# Patient Record
Sex: Female | Born: 1995 | Race: White | Hispanic: No | Marital: Single | Smoking: Never smoker
Health system: Southern US, Community
[De-identification: ages and names within clinical notes are randomized; demographics above are authoritative.]

## PROBLEM LIST (undated history)

## (undated) DIAGNOSIS — N200 Calculus of kidney: Secondary | ICD-10-CM

---

## 2015-01-05 ENCOUNTER — Encounter: Payer: Self-pay | Admitting: Sports Medicine

## 2015-01-05 ENCOUNTER — Other Ambulatory Visit: Payer: PRIVATE HEALTH INSURANCE

## 2015-01-05 ENCOUNTER — Ambulatory Visit
Admission: RE | Admit: 2015-01-05 | Discharge: 2015-01-05 | Disposition: A | Discharge status: Home / Self Care | Payer: PRIVATE HEALTH INSURANCE | Source: Ambulatory Visit | Attending: Sports Medicine | Admitting: Sports Medicine

## 2015-01-05 ENCOUNTER — Ambulatory Visit (INDEPENDENT_AMBULATORY_CARE_PROVIDER_SITE_OTHER): Payer: 59 | Admitting: Sports Medicine

## 2015-01-05 VITALS — BP 112/72 | Ht <= 58 in | Wt 97.0 lb

## 2015-01-05 DIAGNOSIS — M25552 Pain in left hip: Secondary | ICD-10-CM | POA: Diagnosis not present

## 2015-01-05 DIAGNOSIS — M84359A Stress fracture, hip, unspecified, initial encounter for fracture: Secondary | ICD-10-CM

## 2015-01-05 NOTE — Progress Notes (Signed)
Sara Holloway - 19 y.o. female MRN [id number]  Date of birth: 09-20-1996  SUBJECTIVE:  Including CC & ROS.  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 She presents today with 2 weeks of left hip and groin pain. Patient reports that the pain has been gradually increasing over the last 2 weeks. She noticed increased limping with walking and has significant pain within the groin which he describes as a severe soreness. She denies any decreased range of motion. Both her and the athletic trainers report decreased strength. She's been taking anti-inflammatories over-the-counter with fairly good control of her pain. Patient reports the pain sometimes wakes her up at night and is particularly bad in the morning. Patient has a history of stress fracture in her left tibia 2 years ago during high school. Most recent bone density for history of multiple fractures was done on in January 2016 results from PCPs office are pending.  She denies any urinary symptoms of dysuria and no hematuria no constipation or diarrhea. Patient reports normal menstrual cycle monthly lasting 3-4 days of bleeding with no heavy consistency. Patient also reports a fairly poor diet little calcium, no additional vitamin D, poor protein intake. She is a [employer] and lives in the dorms.   ROS: Review of systems otherwise negative except for information present in HPI  HISTORY: Past Medical, Surgical, Social, and Family History Reviewed & Updated per EMR. Pertinent Historical Findings include: Patient reports a past medical history of left tibial stress fracture in 2013 or 14, history of left abductor strains in the past, history of urinary issues in the past. History of osteopenia. No family history of bone or joint disorders Patient is a nonsmoker nondrinker no illegal drug use Reports a fairly poor  diet Is a student ago for college  PHYSICAL EXAM:  VS: BP:112/72 mmHg  HR: bpm  TEMP: ( )  RESP:   HT:4\' 9"  (144.8 cm)   WT:97 lb (43.999 kg)  BMI:  LEFT HIP EXAM:  General: well nourished Skin of LE: warm; dry, no rashes, lesions, ecchymosis or erythema. Vascular: Dorsal pedal and femoral pulses 2+ bilaterally Neurologically: Sensation to light touch lower extremities equal and intact bilaterally.    Observation - no ecchymosis, edema, or hematoma present over the anterior, lateral, or posterior soft tissue surrounding the hip Palpation:  Yes anterior hip joint tenderness Mild point tenderness over left ASIS, more severe point tenderness along the pubic ramus and pubic synthesis. No tenderness of the lateral greater trochanter or bursa, No PSIS tenderness or SI joint pain ROM: Normal Hip motion in flexion, extension, internal and external rotation Muscle strength:Pain and 3/5 weakness in iliopsoas and rectus femoris flexion, normal hamstring extension and gluteal extension at the hip. No pain or weakness with abduction or adduction 5/ 5 compared to the right No pain or weakness with gluteus medius and minimus hip abduction, abdominal muscle contraction forward or oblique positions Pain with single leg hop deep in the groin  MSK US: MSK ultrasound of femoral neck, head and proximal shaft is possibly concerning for periosteal changes approximately 3-4 cm from the femoral head along the medial shaft there is a region of change with a associated blood vessel. Cannot distinguish as this periosteal change is related to shadowing from a blood vessel. Pubic ramus and symphysis did not reveal any bony abnormalities. Musculature of the abductors is  unremarkable.  ASSESSMENT & PLAN: See problem based charting & AVS for pt instructions. Patient is an 19 year old long-distance runner with past medical history of tibial stress fractures, osteopenia, normal menstruations with poor nutrition who  presents today with 2 weeks of left hip pain.  Differential diagnosis: Stress fracture to the femoral neck, stress fracture to the pubic ramus, possible labral tear, rectus femoris or iliopsoas muscular strain.  Recommendations: -Given the patient's history of high mileage running osteopenia poor nutrition and history of stress fractures and most concern for possible pelvic stress fracture either in the pubic ramus or femoral neck. Her ultrasound exam is also concerning with periosteal change within the femoral neck. Plan to further evaluate the patient with bilateral pelvic AP view and left hip x-rays. Patient's also been scheduled for an MRI to further classify and evaluate for possible stress fracture or muscular pathology. Patient was agreeable with this plan. -Given the patient's history of poor nutrition and osteopenia also sent her for a vitamin D 3 level and TSH level.  Follow-up with patient over the phone with test results and discuss further management options at that time.  We spent greater than 50% of our 30 minute office visit in counseling education regarding the patient current clinical problem, risks and benefits of treatment options, and recommend plans for treatment or further evaluation

## 2015-01-06 LAB — TSH: TSH: 0.765 u[IU]/mL (ref 0.350–4.500)

## 2015-01-06 LAB — VITAMIN D 25 HYDROXY (VIT D DEFICIENCY, FRACTURES): Vit D, 25-Hydroxy: 32 ng/mL (ref 30–100)

## 2015-01-07 ENCOUNTER — Ambulatory Visit
Admission: RE | Admit: 2015-01-07 | Discharge: 2015-01-07 | Disposition: A | Discharge status: Home / Self Care | Payer: PRIVATE HEALTH INSURANCE | Source: Ambulatory Visit | Attending: Sports Medicine | Admitting: Sports Medicine

## 2015-01-07 DIAGNOSIS — M25552 Pain in left hip: Secondary | ICD-10-CM

## 2015-01-12 ENCOUNTER — Ambulatory Visit (INDEPENDENT_AMBULATORY_CARE_PROVIDER_SITE_OTHER): Payer: 59 | Admitting: Sports Medicine

## 2015-01-12 ENCOUNTER — Encounter: Payer: Self-pay | Admitting: Sports Medicine

## 2015-01-12 VITALS — BP 115/67 | HR 60 | Ht <= 58 in | Wt 97.0 lb

## 2015-01-12 DIAGNOSIS — M84359A Stress fracture, hip, unspecified, initial encounter for fracture: Secondary | ICD-10-CM

## 2015-01-12 DIAGNOSIS — M84353A Stress fracture, unspecified femur, initial encounter for fracture: Secondary | ICD-10-CM

## 2015-01-12 LAB — FERRITIN: FERRITIN: 22 ng/mL (ref 10–291)

## 2015-01-12 NOTE — Progress Notes (Signed)
[facility] drew:  CBC with diff, Ferritin

## 2015-01-13 LAB — CBC WITH DIFFERENTIAL/PLATELET
BASOS PCT: 0 % (ref 0–1)
Basophils Absolute: 0 10*3/uL (ref 0.0–0.1)
EOS PCT: 1 % (ref 0–5)
Eosinophils Absolute: 0.1 10*3/uL (ref 0.0–0.7)
HEMATOCRIT: 37.8 % (ref 36.0–46.0)
Hemoglobin: 12.5 g/dL (ref 12.0–15.0)
Lymphocytes Relative: 38 % (ref 12–46)
Lymphs Abs: 2.2 10*3/uL (ref 0.7–4.0)
MCH: 30.2 pg (ref 26.0–34.0)
MCHC: 33.1 g/dL (ref 30.0–36.0)
MCV: 91.3 fL (ref 78.0–100.0)
MONO ABS: 0.5 10*3/uL (ref 0.1–1.0)
MONOS PCT: 8 % (ref 3–12)
MPV: 9.5 fL (ref 8.6–12.4)
NEUTROS ABS: 3.1 10*3/uL (ref 1.7–7.7)
Neutrophils Relative %: 53 % (ref 43–77)
Platelets: 262 10*3/uL (ref 150–400)
RBC: 4.14 MIL/uL (ref 3.87–5.11)
RDW: 12.9 % (ref 11.5–15.5)
WBC: 5.9 10*3/uL (ref 4.0–10.5)

## 2015-01-13 NOTE — Progress Notes (Addendum)
Sara Holloway - 19 y.o. female MRN [id number]  Date of birth: [birth year]  SUBJECTIVE:  Including CC & ROS.  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  She presents for follow up after experiencing groin pain for 2 weeks of left hip and groin pain gradually increasing over the last 2 weeks. She noticed increased limping with walking and has significant pain within the groin which he describes as a severe soreness.   At previous office visit ultrasound exam was concerning for possible femoral neck or proximal shaft fracture and thus was sent for x-rays and MRI. X-rays done in the past week were unrevealing for fracture. However MRI revealed a superior medial compression side femoral neck stress fracture with grade 4 edema within the femoral neck.  Currently the patient is on crutches which was started on Monday in the training room. Patient came in today to get fitted for crutches and also to counsel.    ROS: Review of systems otherwise negative except for information present in HPI She denies any urinary symptoms of dysuria and no hematuria no constipation or diarrhea. Patient reports normal menstrual cycle monthly lasting 3-4 days of bleeding with no heavy consistency. Patient also reports a fairly poor diet little calcium, no additional vitamin D, poor protein intake. She is a [employer] and lives in the dorms.  HISTORY: Past Medical, Surgical, Social, and Family History Reviewed & Updated per EMR. Pertinent Historical Findings include: Patient reports a past medical history of left tibial stress fracture in 2013 or 14, history of left abductor strains in the past, history of urinary issues in the past.  History of osteopenia - -Recent DEXA scan from January 2016 show bone mineral  density less than expected based on age with a Z score of -2.2 No family history of bone or joint disorders Patient is a nonsmoker nondrinker no illegal drug use Reports a fairly poor diet Is a student ago for college  PHYSICAL EXAM:  VS: BP:115/67 mmHg  HR:60bpm  TEMP: ( )  RESP:   HT:4\' 9"  (144.8 cm)   WT:97 lb (43.999 kg)  BMI:21 LEFT HIP EXAM:  General: well nourished Skin of LE: warm; dry, no rashes, lesions, ecchymosis or erythema. Vascular: Dorsal pedal and femoral pulses 2+ bilaterally Neurologically: Sensation to light touch lower extremities equal and intact bilaterally.    Observation - no ecchymosis, edema, or hematoma present over the anterior, lateral, or posterior soft tissue surrounding the hip Palpation:  Yes anterior hip joint tenderness, some pubic ramus tenderness Mild point tenderness over left ASIS, more severe point tenderness along the pubic ramus and pubic synthesis. No tenderness of the lateral greater trochanter or bursa, No PSIS tenderness or SI joint pain ROM: Normal Hip motion in flexion, extension, internal and external rotation Muscle strength:Pain and 4/5 weakness in iliopsoas and rectus femoris flexion, normal hamstring extension and gluteal extension at the hip. No pain or weakness with abduction or adduction 5/ 5 compared to the right No pain or weakness with gluteus medius and minimus hip abduction, abdominal muscle contraction forward or oblique positions Pain with single leg hop deep in the groin  MSK [place]: Repeat MSK ultrasound shows possible calcification changes at the proximal femoral neck on the medial aspect  ASSESSMENT & PLAN: See problem based charting &  AVS for pt instructions. Patient is an [age] year old long-distance runner with Compression side left femoral neck stress fracture grade IV edema on MRI  Recommendations: -Patient will remain on crutches for 6 weeks -Educated patient about diet recommendations including increasing  calcium rich foods, increasing protein, and starting vitamin D supplementation at least 1000 mg daily. -Lab work obtained including vitamin D 3 level, TSH level, CBC level, and ferritin level orthotic in normal limits -Patient may participate in swimming with taking off the wall as long she remains pain-free, other crosstraining included crunches, upper body lifting, arm bike. Patient may not do any stationary bike walking or running. -Patient will follow-up in 5 weeks to reassess hopefully with ultrasound and clinically  We spent greater than 50% of our 25 minute office visit in counseling education regarding the patient current clinical problem, risks and benefits of treatment options, and recommend plans for treatment or further evaluation  Adding Iron supplementation for ferritin level of 22 and will treat with ferrous sulfate 325mg  BID recheck in 4 weeks

## 2015-01-17 ENCOUNTER — Encounter: Payer: Self-pay | Admitting: Sports Medicine

## 2015-01-20 MED ORDER — FERROUS SULFATE 325 (65 FE) MG PO TABS: 325.0000 mg | ORAL_TABLET | Freq: Two times a day (BID) | ORAL | Status: AC

## 2015-01-20 NOTE — Addendum Note (Signed)
Addended by: Alonza SmokerIDIANO, Koleman Marling M on: 01/20/2015 08:47 AM   Modules accepted: Orders

## 2015-02-16 ENCOUNTER — Ambulatory Visit (INDEPENDENT_AMBULATORY_CARE_PROVIDER_SITE_OTHER): Payer: 59 | Admitting: Sports Medicine

## 2015-02-16 ENCOUNTER — Ambulatory Visit
Admission: RE | Admit: 2015-02-16 | Discharge: 2015-02-16 | Disposition: A | Discharge status: Home / Self Care | Payer: 59 | Source: Ambulatory Visit | Attending: Sports Medicine | Admitting: Sports Medicine

## 2015-02-16 ENCOUNTER — Encounter: Payer: Self-pay | Admitting: Sports Medicine

## 2015-02-16 VITALS — BP 122/58 | HR 82 | Ht <= 58 in | Wt 97.0 lb

## 2015-02-16 DIAGNOSIS — M84359A Stress fracture, hip, unspecified, initial encounter for fracture: Secondary | ICD-10-CM | POA: Diagnosis not present

## 2015-02-16 DIAGNOSIS — M84353A Stress fracture, unspecified femur, initial encounter for fracture: Secondary | ICD-10-CM

## 2015-02-16 NOTE — Progress Notes (Signed)
  Sara Holloway - 19 y.o. female MRN [id number]  Date of birth: 13-Dec-1995  SUBJECTIVE:  Including CC & ROS.  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 Patient's presenting today for follow-up left femoral neck compression fracture. Patient has been using her crutches and has been nonweightbearing for total of 6 weeks now. Patient reports no significant pain when doing any crutch walking where she's putting some weight on her leg. She's been swimming with no pain or difficulty. She started iron replacement therapy but continues to avoid vitamin D replacement due to her history of kidney stones. Denies any pain at rest or at night. Has not been taking any medication including avoiding NSAIDs.  ROS: Review of systems otherwise negative except for information present in HPI   HISTORY: Past Medical, Surgical, Social, and Family History Reviewed & Updated per EMR. Pertinent Historical Findings include: Patient reports a past medical history of left tibial stress fracture in 2013 or 14, history of left abductor strains in the past, history of urinary issues in the past.  History of osteopenia - -Recent DEXA scan from January 2016 show bone mineral density less than expected based on age with a Z score of -2.2 No family history of bone or joint disorders Patient is a nonsmoker nondrinker no illegal drug use Reports a fairly poor diet Is a student ago for college  PHYSICAL EXAM:  VS: BP:(!) 122/58 mmHg  HR:82bpm  TEMP: ( )  RESP:   HT:4\' 9"  (144.8 cm)   WT:97 lb (43.999 kg)  BMI:21 LEFT HIP EXAM:  General: well nourished Skin of LE: warm; dry, no rashes, lesions, ecchymosis or erythema. Vascular: Dorsal pedal and femoral pulses 2+ bilaterally Neurologically: Sensation to light touch lower extremities equal and intact bilaterally.    Observation - no ecchymosis, edema, or hematoma present over the anterior, lateral, or posterior soft tissue  surrounding the hip Palpation:  Single leg hop test on the left continues to cause discomfort in left hip No pain to palpation over the anterior hip joint or pubic ramus ROM: Normal Hip motion in flexion, extension, internal and external rotation. No pain with rotation Muscle strength: Strength improved to 5/5 in iliopsoas and rectus femoris flexion, normal hamstring extension and gluteal extension at the hip. No pain or weakness with abduction or adduction 5/ 5 compared to the right  MSK US: Ultrasound exam revealed progressive calcification formation at the proximal femoral neck approximately 3-4 cm from curve of the neck.  ASSESSMENT & PLAN: See problem based charting & AVS for pt instructions. 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 -Patient will continue to avoid any running and avoid any stressful crosstraining strengthening program. Specific exercises to avoid were provided to the patient. -Patient may continue swimming and may start doing flip turns in the pool as long she is pain-free. -Patient may start biking but avoid any standing on the bike. -Planning follow-up with patient in 4-5 weeks in the office to fit her for custom orthotics once she is safely been off crutches for 3 weeks. -Patient will continue iron replacement therapy.

## 2015-02-17 ENCOUNTER — Telehealth: Payer: Self-pay | Admitting: Sports Medicine

## 2015-02-17 DIAGNOSIS — M84353D Stress fracture, unspecified femur, subsequent encounter for fracture with routine healing: Secondary | ICD-10-CM

## 2015-02-17 NOTE — Telephone Encounter (Signed)
Notified patient by phone that xray do show femoral neck fracture with some calcification and healing. No progression of fracture compared to MRI. Recommending non-WB for 1 week then crutch walking for 1 week. May stop crutches in 2 weeks as long as she remains pain free. No running for 6 more weeks.   F/u with Lillia AbedLindsay ATC if any issues or in the office in 4-5 week to reassess with US.

## 2015-03-09 ENCOUNTER — Encounter: Payer: Self-pay | Admitting: Sports Medicine

## 2015-03-09 ENCOUNTER — Ambulatory Visit (INDEPENDENT_AMBULATORY_CARE_PROVIDER_SITE_OTHER): Payer: 59 | Admitting: Sports Medicine

## 2015-03-09 VITALS — BP 118/65 | HR 75 | Ht <= 58 in | Wt 97.0 lb

## 2015-03-09 DIAGNOSIS — M84359D Stress fracture, hip, unspecified, subsequent encounter for fracture with routine healing: Secondary | ICD-10-CM | POA: Diagnosis not present

## 2015-03-09 DIAGNOSIS — M84353D Stress fracture, unspecified femur, subsequent encounter for fracture with routine healing: Secondary | ICD-10-CM

## 2015-03-09 DIAGNOSIS — R790 Abnormal level of blood mineral: Secondary | ICD-10-CM

## 2015-03-10 DIAGNOSIS — R790 Abnormal level of blood mineral: Secondary | ICD-10-CM | POA: Insufficient documentation

## 2015-03-10 NOTE — Progress Notes (Signed)
Sara Holloway - 19 y.o. female MRN [id number]  Date of birth: May 05, 1996  SUBJECTIVE:  Including CC & ROS.  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 Repeat x-rays showed no progression of fracture and signs of healing Following the patient with MSK ultrasound has showed progressive healing. Patient reports she is currently pain-free and has been weightbearing for approximately 2 weeks. She has no pain with any jumping. She's been crosstraining with biking and swimming with no difficulty.  She's been taking iron supplementation for her low ferritin levels no difficulty. 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  ROS: Review of systems otherwise negative except for information present in HPI  HISTORY: Past Medical, Surgical, Social, and Family History Reviewed & Updated per EMR. Pertinent Historical Findings include: Patient reports a past medical history of left tibial stress fracture in 2013 or 14, history of left abductor strains in the past, history of urinary issues in the past.  History of osteopenia - -Recent DEXA scan from January 2016 show bone mineral density less than expected based on age with a Z score of -2.2 No family history of bone or joint disorders Patient is a nonsmoker nondrinker no illegal drug use Reports a fairly poor diet Is a student ago for college  PHYSICAL EXAM:  VS: BP:118/65 mmHg  HR:75bpm  TEMP: ( )  RESP:   HT:4\' 9"  (144.8 cm)   WT:97 lb (43.999 kg)  BMI:21 LEFT HIP EXAM:  General: well nourished Skin of LE: warm; dry, no rashes, lesions,  ecchymosis or erythema. Vascular: Dorsal pedal and femoral pulses 2+ bilaterally Neurologically: Sensation to light touch lower extremities equal and intact bilaterally.    Observation - no ecchymosis, edema, or hematoma present over the anterior, lateral, or posterior soft tissue surrounding the hip Palpation:  Single leg hop test no discomfort or pain No pain to palpation over the anterior hip joint or pubic ramus ROM: Normal Hip motion in flexion, extension, internal and external rotation. No pain with rotation Muscle strength: Strength improved to 5/5 in iliopsoas and rectus femoris flexion, normal hamstring extension and gluteal extension at the hip.  Gait analysis:  Striking location: lateral foot  Foot motion: supination  Knee motion: neutral  Hip motion: external rotation  MSK [place]: Ultrasound exam revealed progressive calcification formation at the proximal femoral neck approximately 3-4 cm from curve of the neck.  Most recent x-rays from 02/16/15 showed no progression of fracture compared to MRI with some calcification formation and healing.  ASSESSMENT & PLAN: See problem based charting & AVS for pt instructions. 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 This is interval running and walking program. Patient came continue crosstraining with biking and swimming. -Will follow-up with patient in the training room before leaving for the summer if possible. -Will follow-up the patient  by email over the summer to make sure she's progressing well. -Advised patient to back up in one week on her return to play protocol if she develops any pain. -Advised her to have  her ferritin level and iron level checked over the summer to determine if these levels are returning to normal.  Orthotic Fitting and Adjustment note: Patient was fitted for a : standard, cushioned, semi-rigid orthotic.  The orthotic was heated and afterward the patient stood on the orthotic blank positioned on the orthotic stand.  The patient was positioned in subtalar neutral position and 10 degrees of ankle dorsiflexion in a weight bearing stance.  After completion of molding, a stable base was applied to the orthotic blank.  The blank was ground to a stable position for weight bearing.  Size: 6 Base: Blue EVA Posting: lateral post Additional orthotic padding: none  Greater than 50% of the patient's visit for a total of 40 minutes, was spent conducting face-to-face counseling for bilateral foot orthotics fitting and constructing

## 2016-01-18 ENCOUNTER — Encounter (HOSPITAL_COMMUNITY): Payer: Self-pay | Admitting: Emergency Medicine

## 2016-01-18 ENCOUNTER — Emergency Department (HOSPITAL_COMMUNITY)
Admission: EM | Admit: 2016-01-18 | Discharge: 2016-01-18 | Disposition: A | Discharge status: Home / Self Care | Payer: 59 | Attending: Emergency Medicine | Admitting: Emergency Medicine

## 2016-01-18 DIAGNOSIS — L03011 Cellulitis of right finger: Secondary | ICD-10-CM | POA: Insufficient documentation

## 2016-01-18 DIAGNOSIS — Z87442 Personal history of urinary calculi: Secondary | ICD-10-CM | POA: Diagnosis not present

## 2016-01-18 DIAGNOSIS — L089 Local infection of the skin and subcutaneous tissue, unspecified: Secondary | ICD-10-CM | POA: Diagnosis present

## 2016-01-18 DIAGNOSIS — Z79899 Other long term (current) drug therapy: Secondary | ICD-10-CM | POA: Insufficient documentation

## 2016-01-18 HISTORY — DX: Calculus of kidney: N20.0

## 2016-01-18 MED ORDER — LIDOCAINE HCL 2 % IJ SOLN
10.0000 mL | Freq: Once | INTRAMUSCULAR | Status: AC
  Administered 2016-01-18: 200 mg via INTRADERMAL
  Filled 2016-01-18: qty 20

## 2016-01-18 MED ORDER — TRAMADOL HCL 50 MG PO TABS: 50.0000 mg | ORAL_TABLET | Freq: Four times a day (QID) | ORAL | Status: AC | PRN

## 2016-01-18 MED ORDER — CLINDAMYCIN HCL 150 MG PO CAPS
150.0000 mg | ORAL_CAPSULE | Freq: Four times a day (QID) | ORAL | Status: AC
Start: 2016-01-18 — End: ?

## 2016-01-18 NOTE — ED Notes (Signed)
Pt presents to ed for assessment of a possible infection of the finger tip.  Pt things she may have injured it playing frisbee yesterday.  Pt states she was unable to sleep last night.  Sts fever and felt pulsating to the finger in the middle of the night.  NAD.

## 2016-01-18 NOTE — Discharge Instructions (Signed)
Paronychia °Paronychia is an infection of the skin that surrounds a nail. It usually affects the skin around a fingernail, but it may also occur near a toenail. It often causes pain and swelling around the nail. This condition may come on suddenly or develop over a longer period. In some cases, a collection of pus (abscess) can form near or under the nail. Usually, paronychia is not serious and it clears up with treatment. °CAUSES °This condition may be caused by bacteria or fungi. It is commonly caused by either Streptococcus or Staphylococcus bacteria. The bacteria or fungi often cause the infection by getting into the affected area through an opening in the skin, such as a cut or a hangnail. °RISK FACTORS °This condition is more likely to develop in: °· People who get their hands wet often, such as those who work as dishwashers, bartenders, or nurses. °· People who bite their fingernails or suck their thumbs. °· People who trim their nails too short. °· People who have hangnails or injured fingertips. °· People who get manicures. °· People who have diabetes. °SYMPTOMS °Symptoms of this condition include: °· Redness and swelling of the skin near the nail. °· Tenderness around the nail when you touch the area. °· Pus-filled bumps under the cuticle. The cuticle is the skin at the base or sides of the nail. °· Fluid or pus under the nail. °· Throbbing pain in the area. °DIAGNOSIS °This condition is usually diagnosed with a physical exam. In some cases, a sample of pus may be taken from an abscess to be tested in a lab. This can help to determine what type of bacteria or fungi is causing the condition. °TREATMENT °Treatment for this condition depends on the cause and severity of the condition. If the condition is mild, it may clear up on its own in a few days. Your health care provider may recommend soaking the affected area in warm water a few times a day. When treatment is needed, the options may  include: °· Antibiotic medicine, if the condition is caused by a bacterial infection. °· Antifungal medicine, if the condition is caused by a fungal infection. °· Incision and drainage, if an abscess is present. In this procedure, the health care provider will cut open the abscess so the pus can drain out. °HOME CARE INSTRUCTIONS °· Soak the affected area in warm water if directed to do so by your health care provider. You may be told to do this for 20 minutes, 2-3 times a day. Keep the area dry in between soakings. °· Take medicines only as directed by your health care provider. °· If you were prescribed an antibiotic medicine, finish all of it even if you start to feel better. °· Keep the affected area clean. °· Do not try to drain a fluid-filled bump yourself. °· If you will be washing dishes or performing other tasks that require your hands to get wet, wear rubber gloves. You should also wear gloves if your hands might come in contact with irritating substances, such as cleaners or chemicals. °· Follow your health care provider's instructions about: °¨ Wound care. °¨ Bandage (dressing) changes and removal. °SEEK MEDICAL CARE IF: °· Your symptoms get worse or do not improve with treatment. °· You have a fever or chills. °· You have redness spreading from the affected area. °· You have continued or increased fluid, blood, or pus coming from the affected area. °· Your finger or knuckle becomes swollen or is difficult to move. °  °  This information is not intended to replace advice given to you by your health care provider. Make sure you discuss any questions you have with your health care provider. °  °Document Released: 04/23/2001 Document Revised: 03/14/2015 Document Reviewed: 10/05/2014 °Elsevier Interactive Patient Education ©2016 [employer] ° °

## 2016-01-18 NOTE — ED Notes (Signed)
Patient able to ambulate independently  

## 2016-01-18 NOTE — ED Provider Notes (Signed)
CSN: [id number]     Arrival date & time 01/18/16  1902 History  By signing my name below, I, [facility], attest that this documentation has been prepared under the direction and in the presence of Fayrene HelperBowie Iyana Topor, PA-C. Electronically Signed: Randell PatientMarrissa Washington, ED Scribe. 01/18/2016. 7:34 PM.   Chief Complaint  Patient presents with  . Hand Injury    The history is provided by the patient. No language interpreter was used.   HPI Comments: Alberteen SamSamantha Haston is a 20 y.o. female who presents to the Emergency Department complaining of a not painful area that is gradually increasing in redness and swelling to the tip of her right ring finger onset yesterday. Patient reports that she plays multiple sports and bites her nails and was playing ultimate Frisbee yesterday when she believes that dust may have gotten into an open wound. She states that she woke from sleep with a not painful, throbbing sensation in her finger like a pulse and fever. Her friend stated that the pt has [facility] and [facility] who were concerned about infection spreading below her nail bed or into her bone and advised her to present to the ED today. She endorses associated swelling in the finger near the nail bed. She is unable to bend her finger secondary to swelling and discomfort. Hx of multiple RUE fractures and kidney stones. She is an occasional alcohol drinker but does not take illicit drugs or smoke cigarettes. Patient is unable to recall date of last tetanus shot. She denies pain in the area.   Past Medical History  Diagnosis Date  . Kidney stones    No past surgical history on file. No family history on file. Social History  Substance Use Topics  . Smoking status: Never Smoker   . Smokeless tobacco: Not on file  . Alcohol Use: 0.0 oz/week    0 Standard drinks or equivalent per week     Comment: occasionally   OB History    No data available      Review of Systems  Constitutional: Positive for fever.  Skin: Positive for color change (erythema) and wound.      Allergies  Review of patient's allergies indicates no known allergies.  Home Medications   Prior to Admission medications   Medication Sig Start Date End Date Taking? Authorizing Provider  azithromycin (ZITHROMAX) 250 MG tablet See admin instructions. 11/28/14   Historical Provider, MD  ferrous sulfate (FERROUSUL) 325 (65 FE) MG tablet Take 1 tablet (325 mg total) by mouth 2 (two) times daily with a meal. 01/20/15   Deanna M Didiano, DO  ketoconazole (NIZORAL) 2 % shampoo  10/13/14   Historical Provider, MD  sulfacetamide (BLEPH-10) 10 % ophthalmic solution  11/28/14   Historical Provider, MD   BP 122/71 mmHg  Pulse 78  Temp(Src) 98 F (36.7 C) (Oral)  Resp 16  SpO2 100% Physical Exam  Constitutional: She is oriented to person, place, and time. She appears well-developed and well-nourished. No distress.  HENT:  Head: Normocephalic and atraumatic.  Eyes: Conjunctivae and EOM are normal.  Neck: Neck supple. No tracheal deviation present.  Cardiovascular: Normal rate.   Pulmonary/Chest: Effort normal. No respiratory distress.  Musculoskeletal: Normal range of motion.  Neurological: She is alert and oriented to person, place, and time.  Skin: Skin is warm and dry.  Right hand ring finger erythema noted to the lateral cuticle nail fold with fluctuance and TTP. No nail involvement.  Normal capillary refill. No joint involvement.  Psychiatric: She has a normal mood and affect. Her behavior is normal.  Nursing note and vitals reviewed.   ED Course  Procedures   DIAGNOSTIC STUDIES: Oxygen Saturation is 100% on RA, normal by my interpretation.    COORDINATION OF CARE: 7:21 PM signs of a paronychia to R ring finger without any complication.  No signs of a felon and no tendon or joint involvement.  Will perform I&D of right fourth finger. Will prescribe antibiotics.  Discussed treatment plan with pt at bedside and pt agreed to plan.  INCISION AND DRAINAGE PROCEDURE NOTE: Patient identification was confirmed and verbal consent was obtained. This procedure was performed by Fayrene Helper, PA-C at 7:40 PM. Site: R ring finger, lateral nail fold Sterile procedures observed Needle size: 25 Anesthetic used (type and amt): lidocaine 2% without epi.  Digital nerve block Blade size: 11 Drainage: mild Complexity: Complex Packing used: gauze Site anesthetized, incision made over site, wound drained and explored loculations, rinsed with copious amounts of normal saline, wound packed with sterile gauze, covered with dry, sterile dressing.  Pt tolerated procedure well without complications.  Instructions for care discussed verbally and pt provided with additional written instructions for homecare and f/u.   MDM   Final diagnoses:  Paronychia, acute, finger, right    Patient with skin abscess. Incision and drainage performed in the ED today.  Abscess was not large enough to warrant packing or drain placement. Wound recheck in 2 days. Supportive care and return precautions discussed.  Pt sent home with clindamycin, ultram, wound care and return precaution. The patient appears reasonably screened and/or stabilized for discharge and I doubt any other emergent medical condition requiring further screening, evaluation, or treatment in the ED prior to discharge.  BP 122/71 mmHg  Pulse 78  Temp(Src) 98 F (36.7 C) (Oral)  Resp 16  SpO2 100%  I personally performed the services described in this documentation, which was scribed in my presence. The recorded information has been reviewed and is accurate.      Fayrene Helper, PA-C 01/18/16 2023  Zadie Rhine, MD 01/19/16 [phone]

## 2016-08-06 ENCOUNTER — Ambulatory Visit (INDEPENDENT_AMBULATORY_CARE_PROVIDER_SITE_OTHER): Payer: 59 | Admitting: Sports Medicine

## 2016-08-06 ENCOUNTER — Encounter: Payer: Self-pay | Admitting: Sports Medicine

## 2016-08-06 VITALS — BP 119/61 | HR 60 | Ht <= 58 in | Wt 97.0 lb

## 2016-08-06 DIAGNOSIS — M216X9 Other acquired deformities of unspecified foot: Secondary | ICD-10-CM | POA: Diagnosis not present

## 2016-08-07 NOTE — Progress Notes (Signed)
  Patient comes in today for new orthotics. 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 Gait analysis at that time showed that she is a forefoot striker landing in supination. Custom orthotics were constructed. We added a lateral post to those orthotics and they have been very comfortable. She has not had any further hip pain. She has simply worn out her old orthotics and would like a new pair.  New orthotics were constructed today. Patient ran with the orthotics in place and found them to be comfortable. Total time spent with the patient was 30 minutes with greater than 50% of the time spent in face-to-face consultation discussing orthotic construction, instruction, and fitting. She will follow-up with me either in the office or in the [employer] training room as needed.  Patient was fitted for a : standard, cushioned, semi-rigid orthotic. The orthotic was heated and afterward the patient stood on the orthotic blank positioned on the orthotic stand. The patient was positioned in subtalar neutral position and 10 degrees of ankle dorsiflexion in a weight bearing stance. After completion of molding, a stable base was applied to the orthotic blank. The blank was ground to a stable position for weight bearing. Size: 6 Base: Blue EVA Posting: B/L lateral (fifth ray) posts Additional orthotic padding: none

## 2016-08-19 IMAGING — MR MR PELVIS W/O CM
4 of 5 series · 15 of 48 positions shown · non-contrast
Comparison: 01/05/2015 radiographs.

CLINICAL DATA: Left hip pain for 2 weeks.  Long distance runner.

EXAM:
MRI PELVIS WITHOUT CONTRAST
TECHNIQUE: Multiplanar multisequence MR imaging of the pelvis was performed. No
intravenous contrast was administered. Bony pelvis protocol
utilized.

[Series 3: T1 · axial · 5.0mm · 0.48mm/px · z∈[-102,+78]mm · 6 of 35 slices shown (1 of 2)]
[im 1/35]
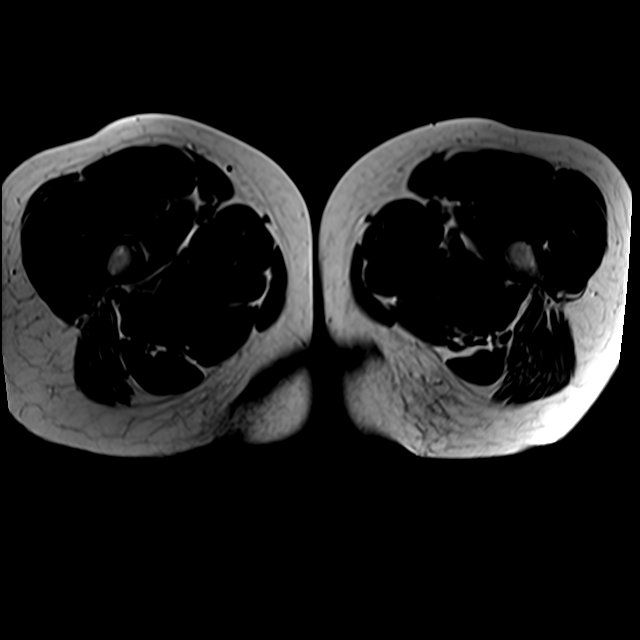
[im 4/35]
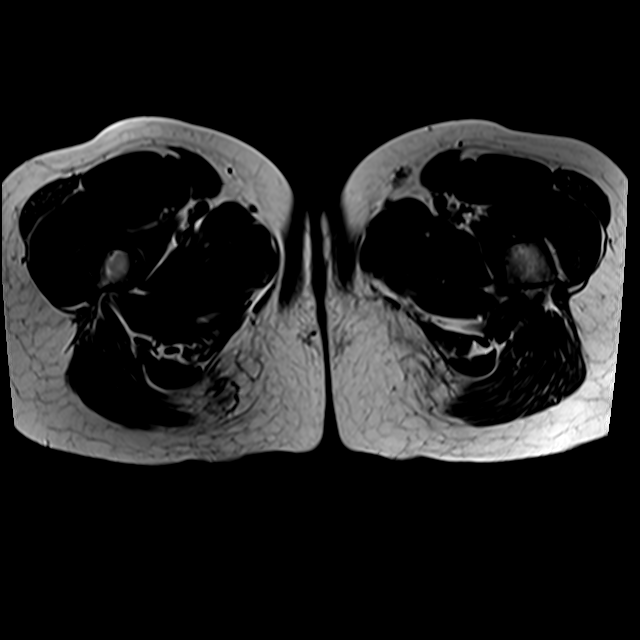
[im 7/35]
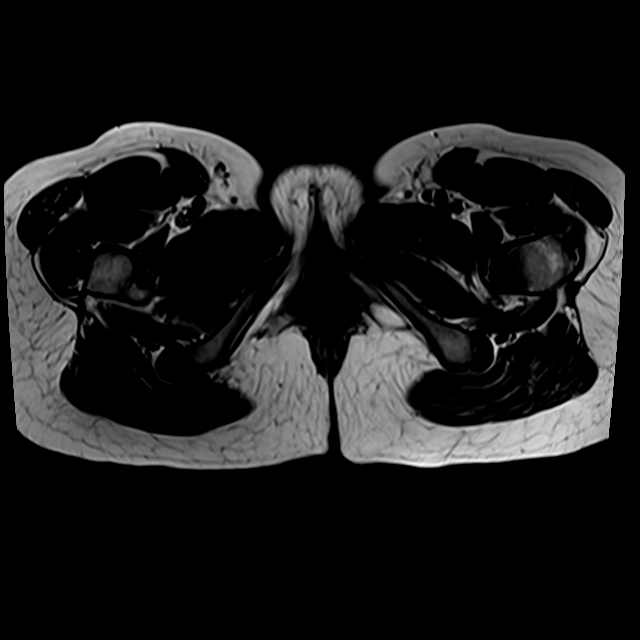
[im 11/35]
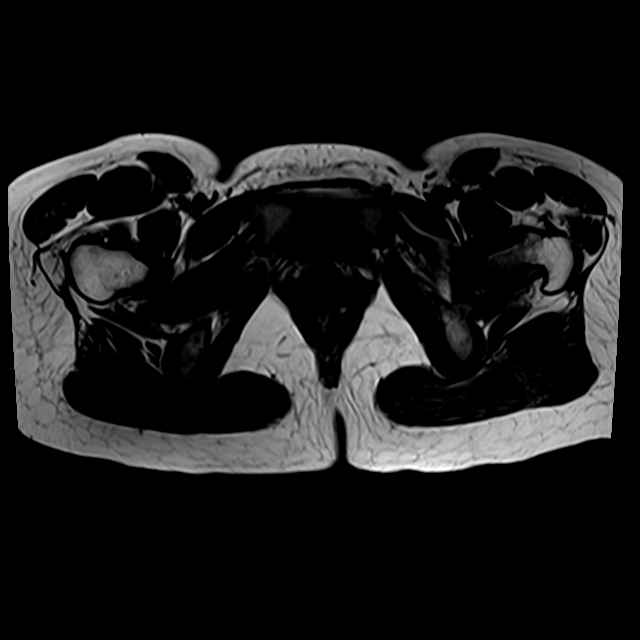
[im 18/35]
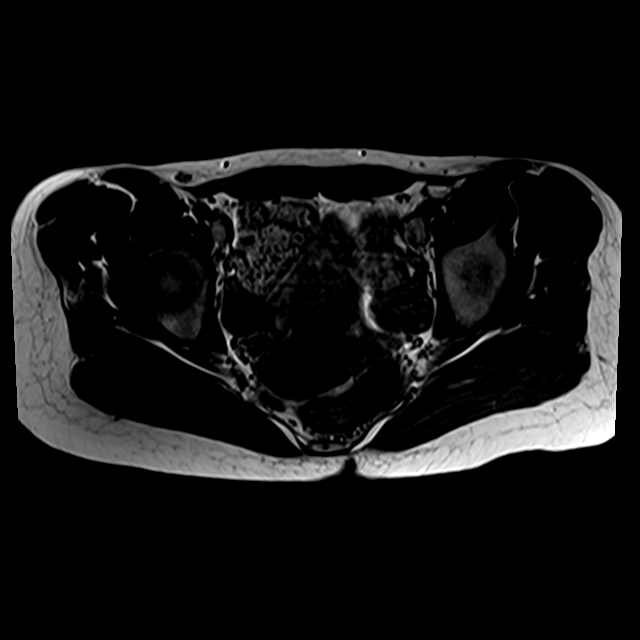
[im 31/35]
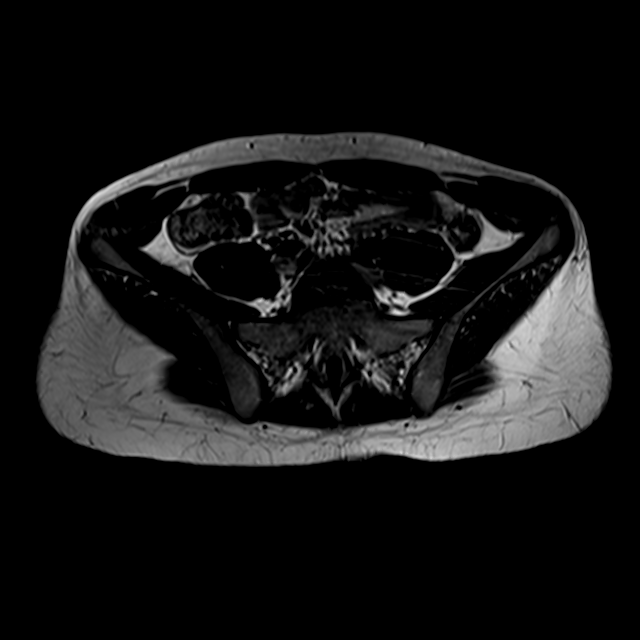

[Series 4: T2 fat-sat · axial · 5.0mm · 0.48mm/px · z∈[-84,+78]mm · 3 of 35 slices shown]
[im 4/35]
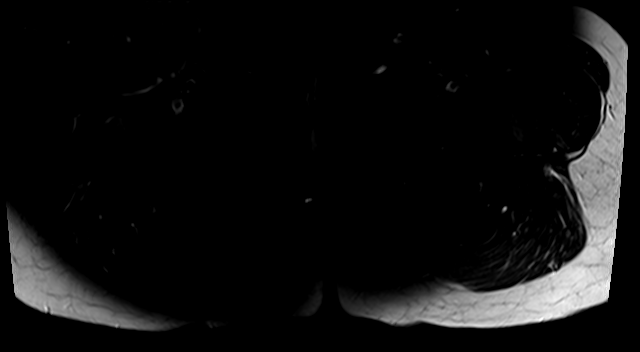
[im 19/35]
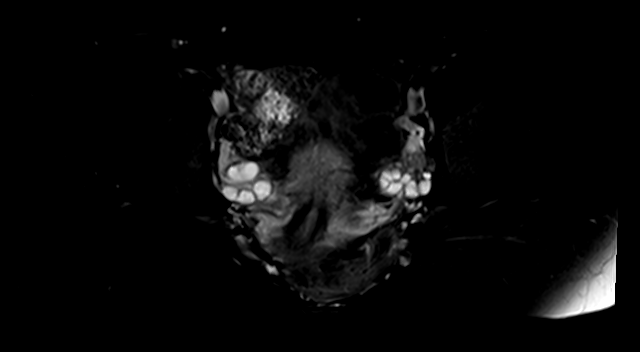
[im 31/35]
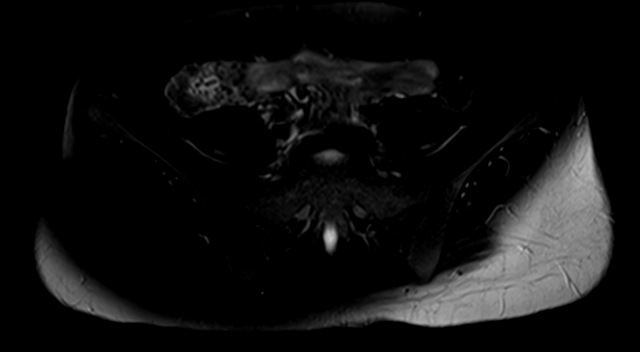

[Series 5: T1 · coronal · 5.0mm · 0.48mm/px · 3 of 25 slices shown (2 of 2)]
[im 5/25]
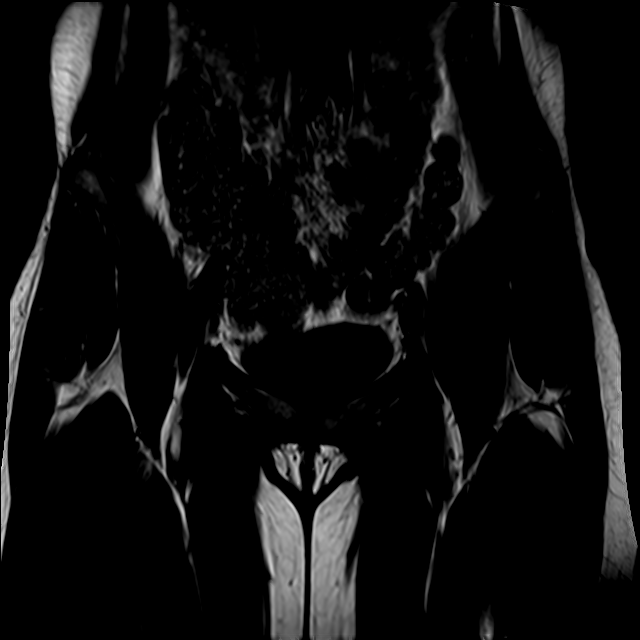
[im 13/25]
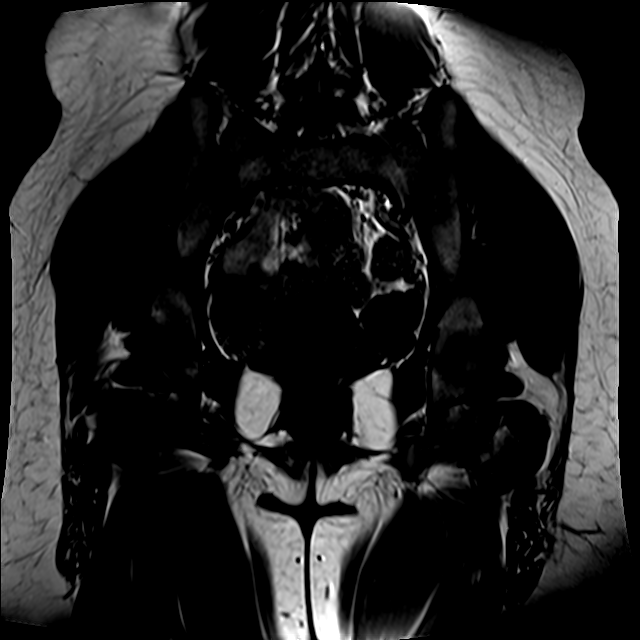
[im 21/25]
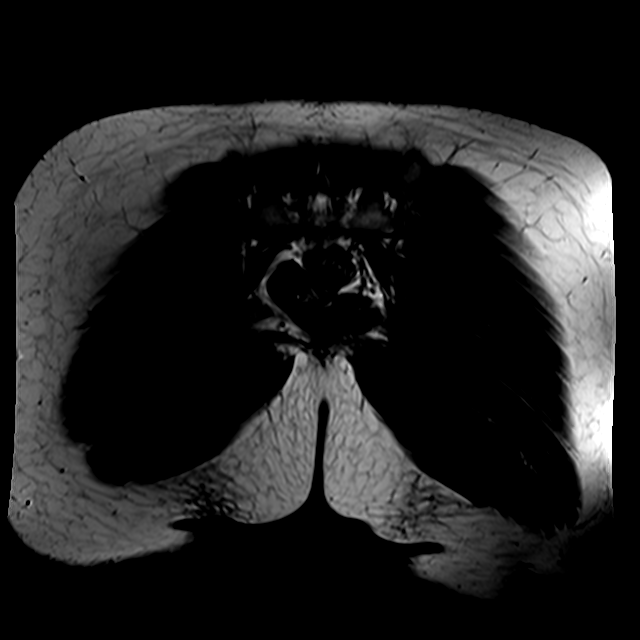

[Series 6: STIR · coronal · 5.0mm · 0.61mm/px · 3 of 25 slices shown]
[im 5/25]
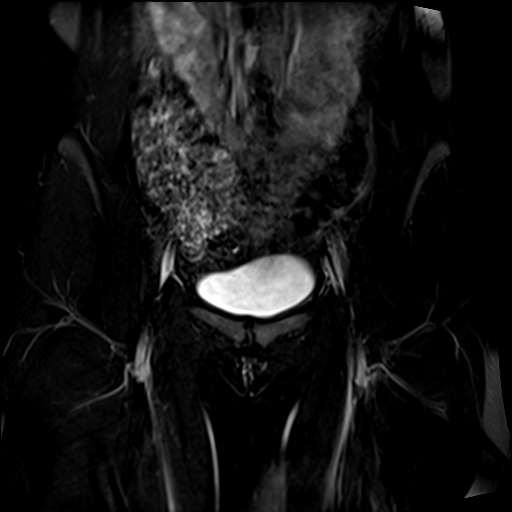
[im 13/25]
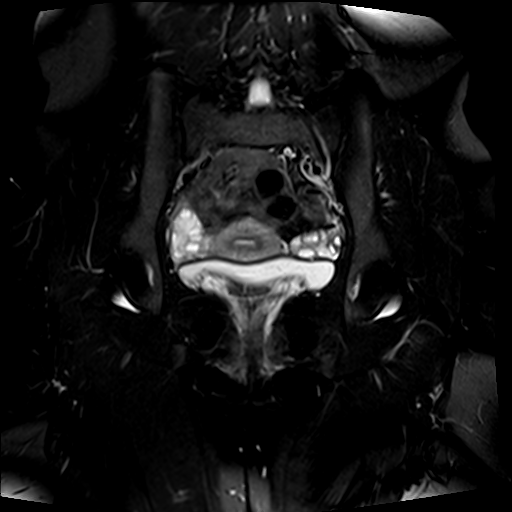
[im 21/25]
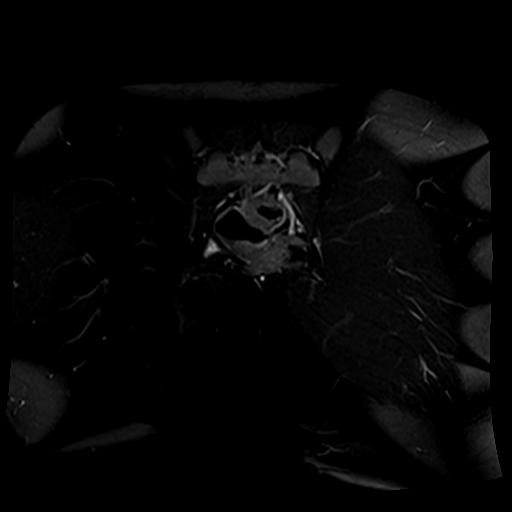

[15 of 48 positions shown; findings below may reference images not displayed]

FINDINGS: There is an acute medial fracture of the left femoral neck in the
basicervical region, with linear low signal as shown on image 16 of
series 6, and surrounding marrow edema. No additional significant
osseous abnormalities identified.

No hip effusion or regional bursitis. Regional musculature
unremarkable.

No specific tendon abnormality observed. No sciatic notch or
obturator impingement.

Uterus and ovaries normal. Pubic bodies and adjacent aponeurotic
attachments intact.
IMPRESSION: 1. Stress fracture of the medial left femoral neck with surrounding
marrow edema.

## 2016-09-28 IMAGING — CR DG HIP (WITH OR WITHOUT PELVIS) 2-3V*L*
1 series · 1 of 1 positions shown · non-contrast
Comparison: Radiographs 01/05/2015 and MRI 01/07/2015.

CLINICAL DATA: Follow up left femoral neck stress fracture. Patient
reports no current symptoms.

EXAM:
LEFT HIP (WITH PELVIS) 2-3 VIEWS

[t hip frog leg left]
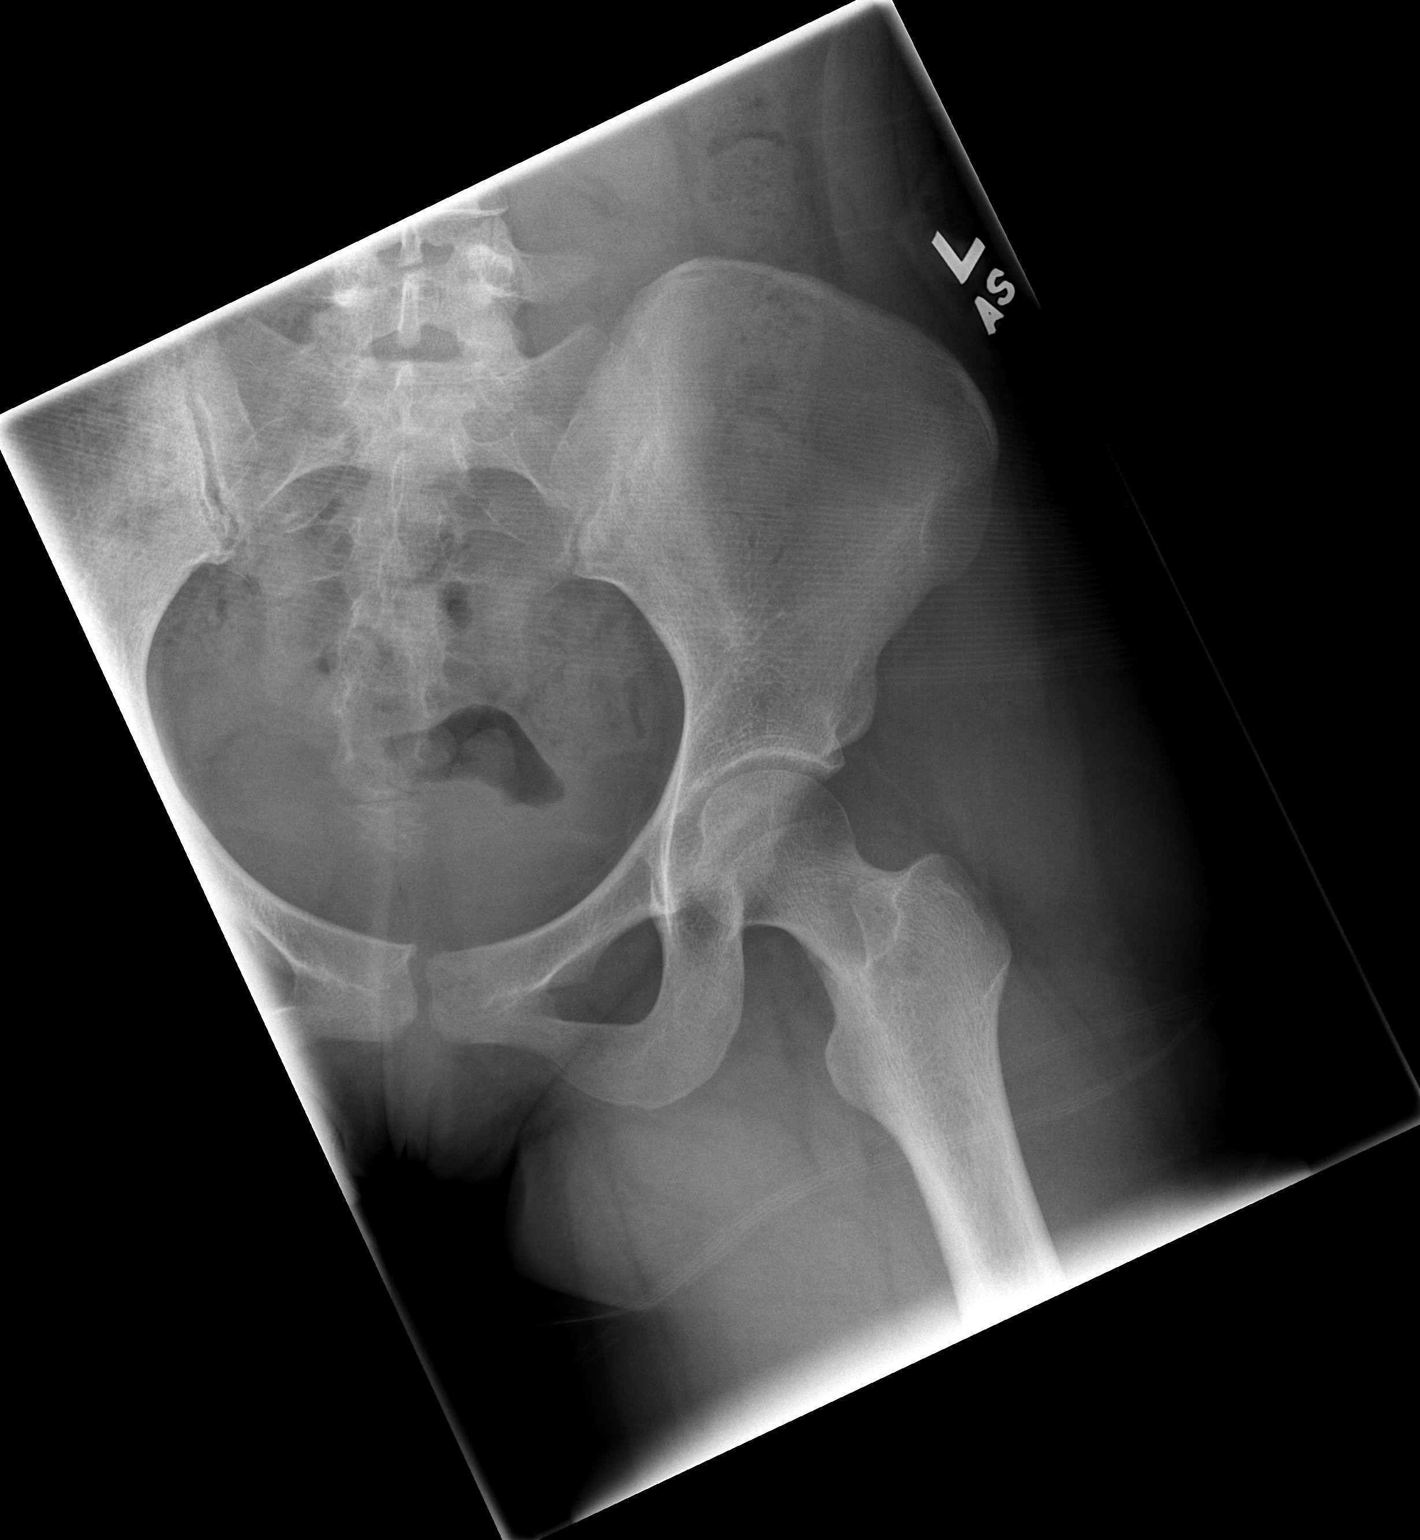

[1 of 1 positions shown; findings below may reference images not displayed]

FINDINGS: There is minimal lucency medially within the left femoral neck at
the site of the stress fracture demonstrated on MRI. There is
probable mild surrounding sclerosis suggesting partial healing. No
definite fracture progression or extension across the femoral neck
demonstrated. There is no evidence of femoral head avascular
necrosis or dislocation.
IMPRESSION: Probable interval partial healing of left femoral neck stress
fracture. No definite fracture progression.

## 2016-09-28 IMAGING — CR DG HIP (WITH OR WITHOUT PELVIS) 2-3V*L*
2 series · 2 of 2 positions shown · non-contrast
Comparison: Radiographs 01/05/2015 and MRI 01/07/2015.

CLINICAL DATA: Follow up left femoral neck stress fracture. Patient
reports no current symptoms.

EXAM:
LEFT HIP (WITH PELVIS) 2-3 VIEWS

[w pelvis]
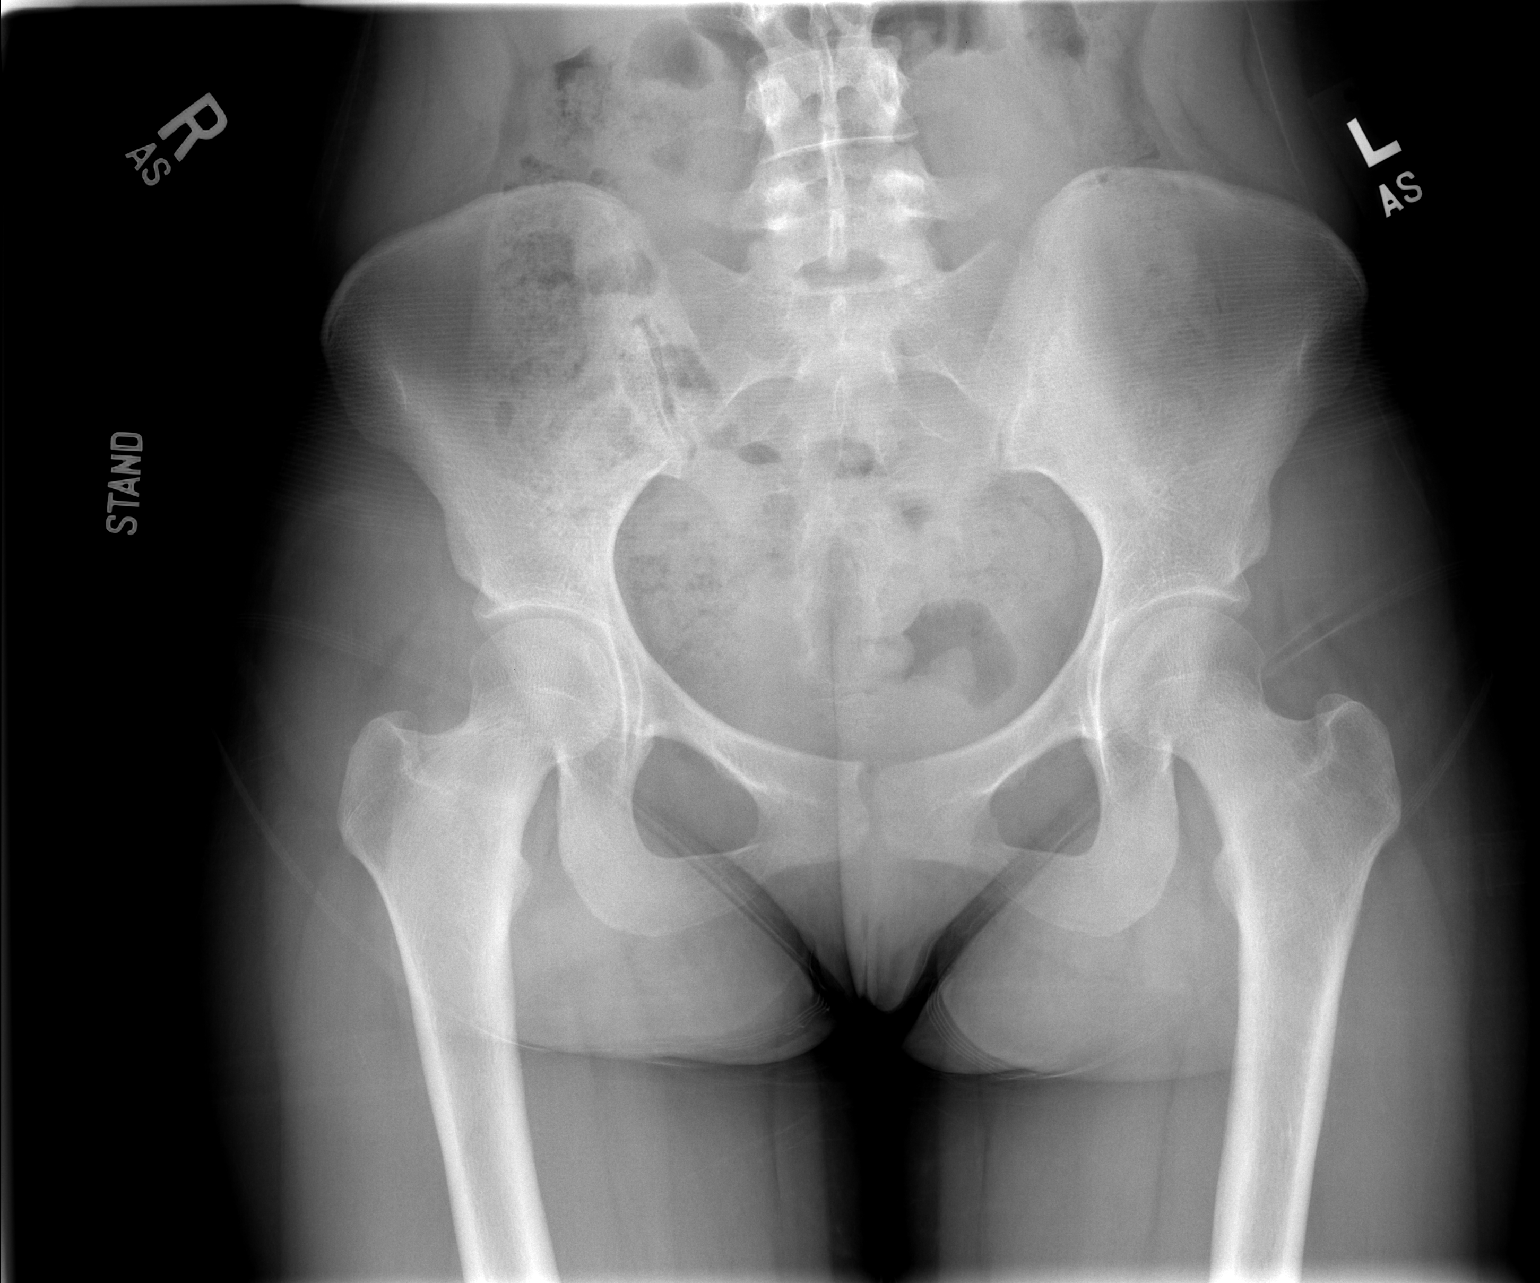

[w pelvis *]
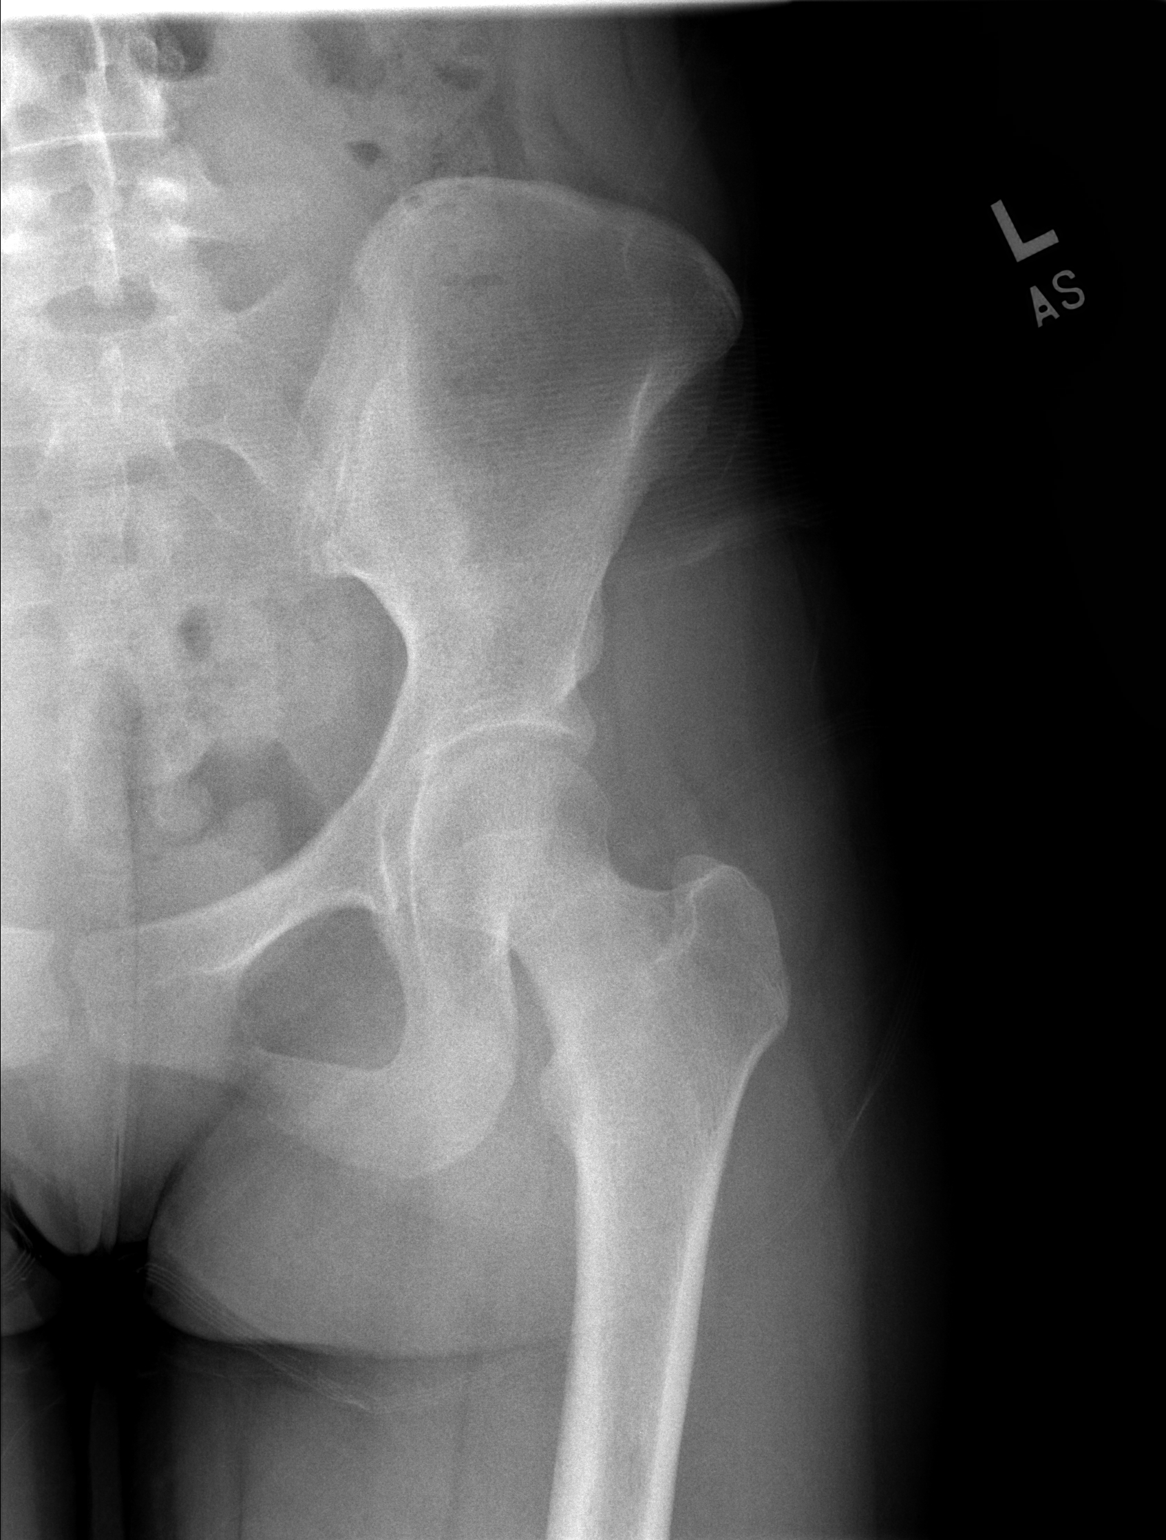

[2 of 2 positions shown; findings below may reference images not displayed]

FINDINGS: There is minimal lucency medially within the left femoral neck at
the site of the stress fracture demonstrated on MRI. There is
probable mild surrounding sclerosis suggesting partial healing. No
definite fracture progression or extension across the femoral neck
demonstrated. There is no evidence of femoral head avascular
necrosis or dislocation.
IMPRESSION: Probable interval partial healing of left femoral neck stress
fracture. No definite fracture progression.
